# Patient Record
Sex: Female | Born: 1980 | Race: White | Hispanic: No | Marital: Single | Smoking: Current every day smoker
Health system: Southern US, Community
[De-identification: ages and names within clinical notes are randomized; demographics above are authoritative.]

---

## 2006-07-12 ENCOUNTER — Emergency Department: Payer: Self-pay | Admitting: Emergency Medicine

## 2007-03-09 ENCOUNTER — Emergency Department: Payer: Self-pay | Admitting: Emergency Medicine

## 2010-08-12 ENCOUNTER — Emergency Department: Payer: Self-pay | Admitting: Emergency Medicine

## 2011-08-07 IMAGING — CT CT HEAD WITHOUT CONTRAST
2 series · 16 of 30 positions shown, 20 images · non-contrast
Comparison: none

REASON FOR EXAM: R sided headache
COMMENTS:

PROCEDURE:     CT  - CT HEAD WITHOUT CONTRAST  - August 12, 2010  [DATE]
RESULT:
HISTORY: Headache.
COMPARISON STUDIES:  No prior.

[Series 2: without · axial · non-contrast · 0.44mm/px · z∈[+200,+320]mm · 13 of 30 slices shown, 17 images]
[im 3/30  brain]
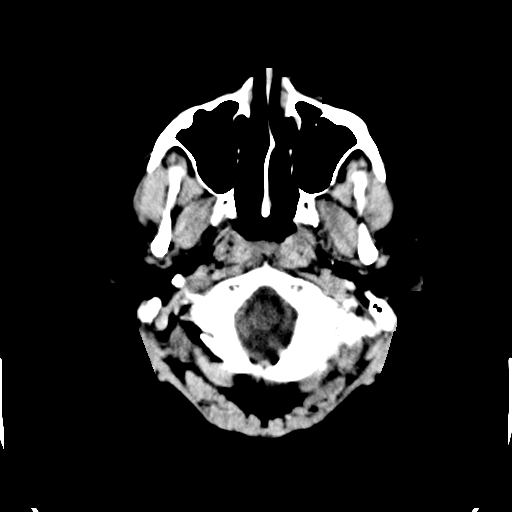
[im 3/30  bone]
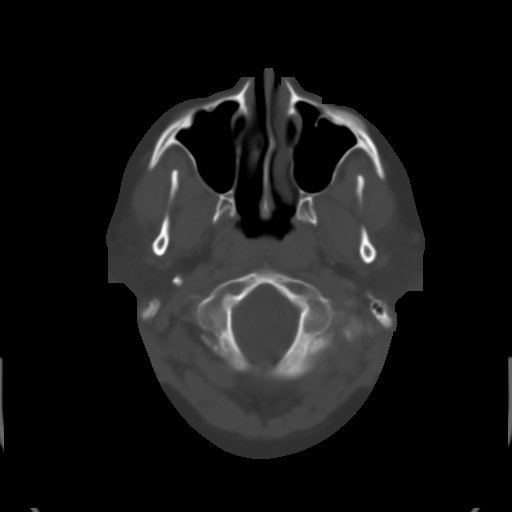
[im 5/30  brain]
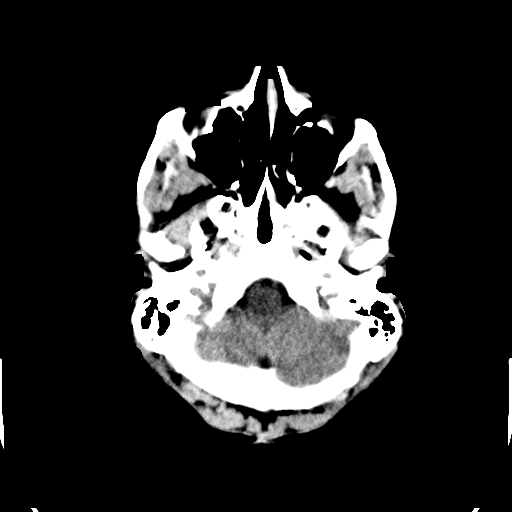
[im 7/30  brain]
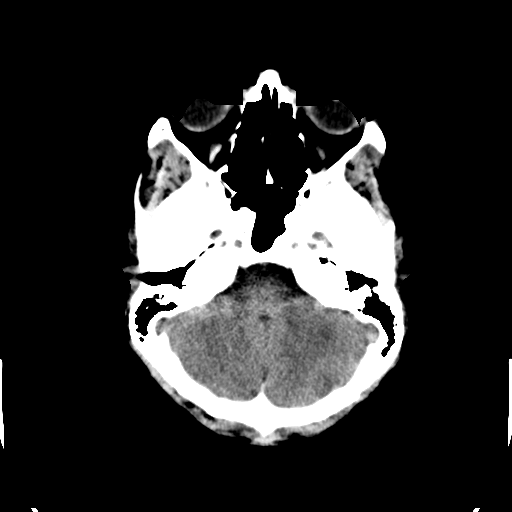
[im 9/30  brain]
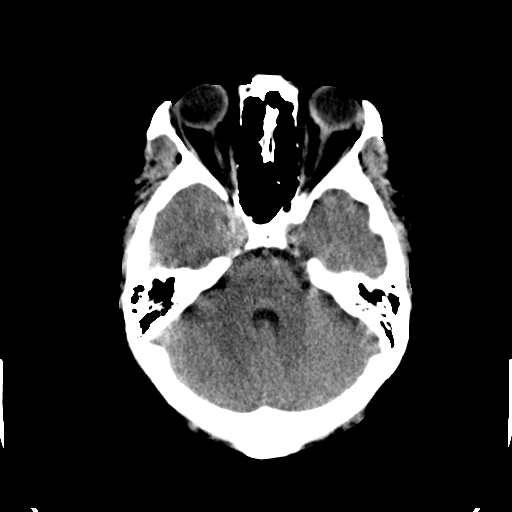
[im 11/30  brain]
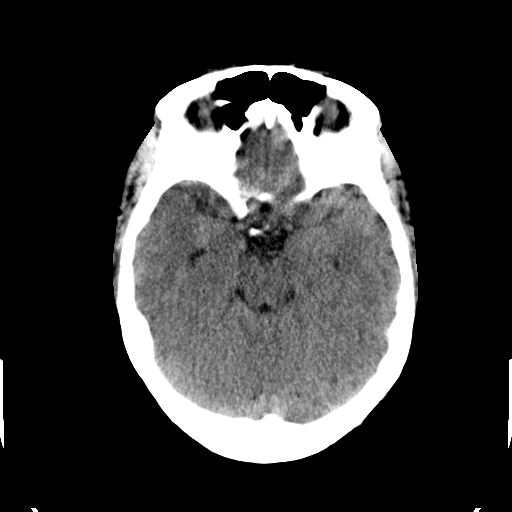
[im 11/30  bone]
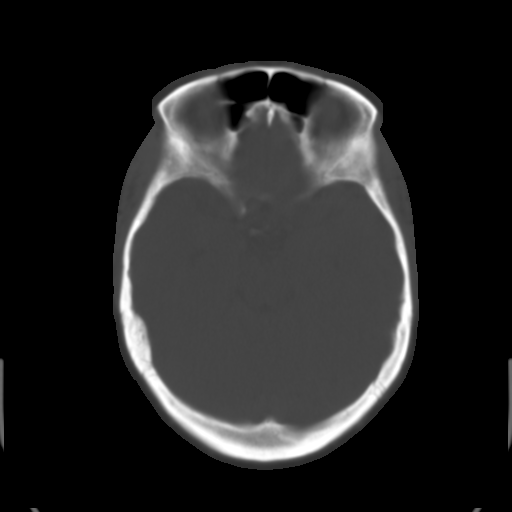
[im 13/30  brain]
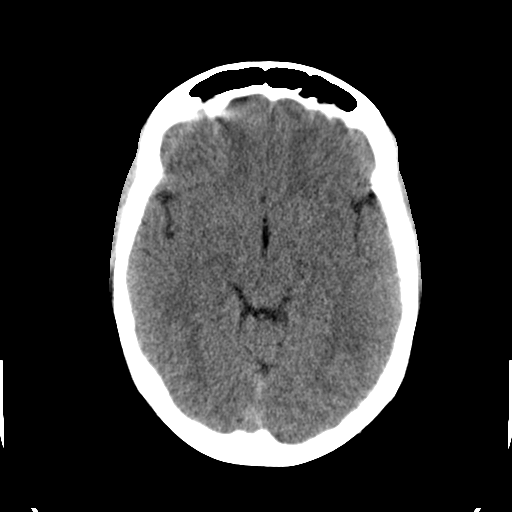
[im 15/30  brain]
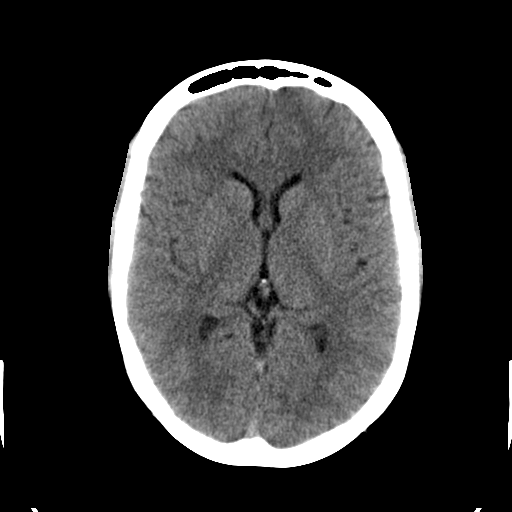
[im 17/30  brain]
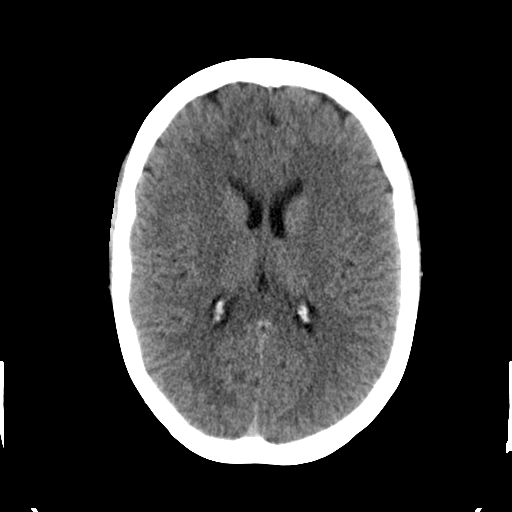
[im 19/30  brain]
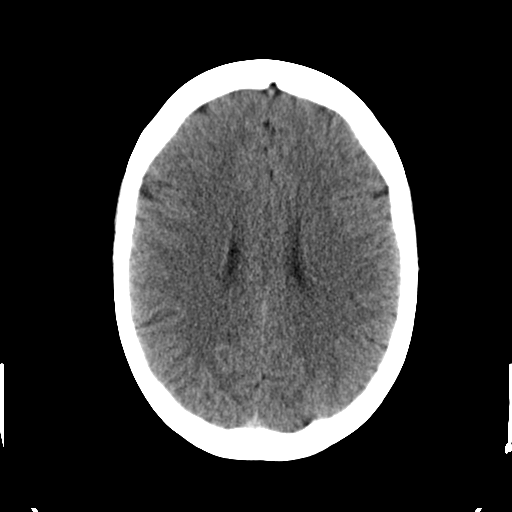
[im 19/30  bone]
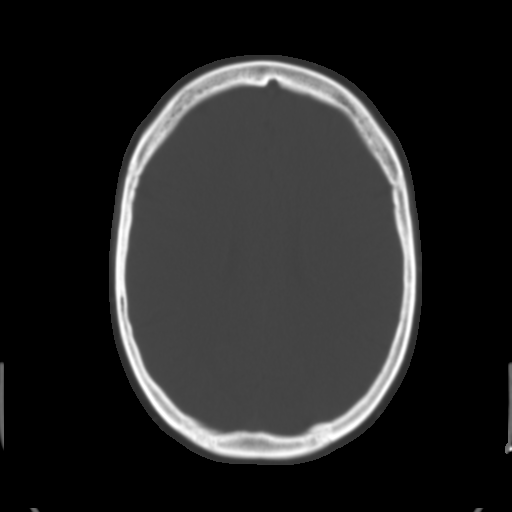
[im 21/30  brain]
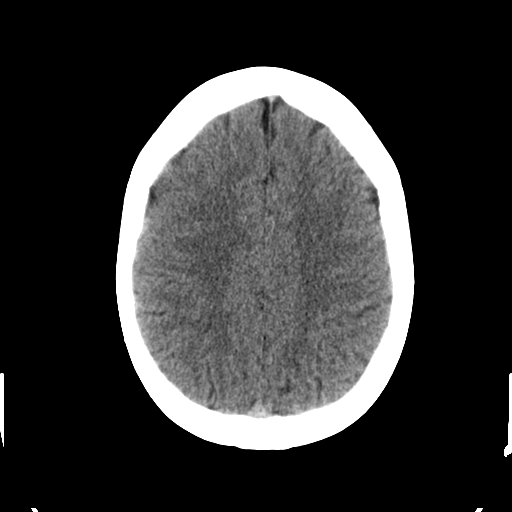
[im 23/30  brain]
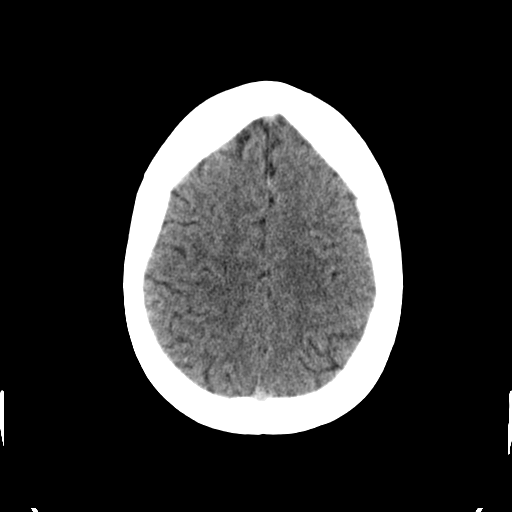
[im 25/30  brain]
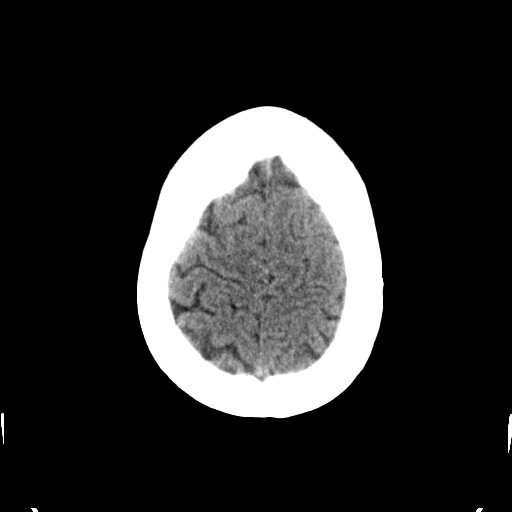
[im 27/30  brain]
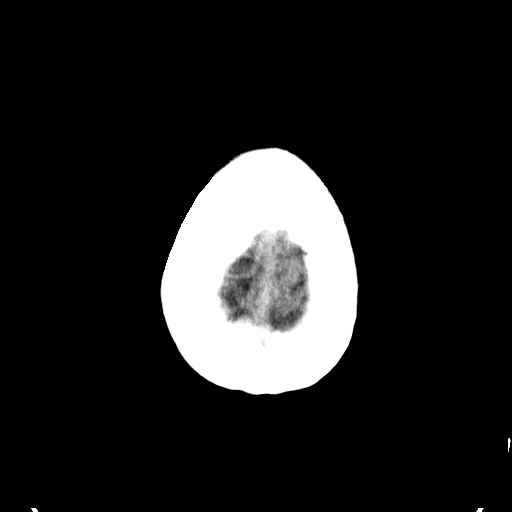
[im 27/30  bone]
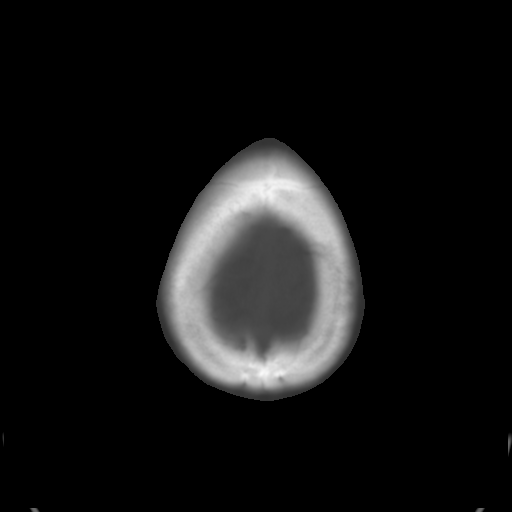

[Series 3: bone · axial · 0.44mm/px · z∈[+200,+240]mm · 3 of 30 slices shown]
[im 3/30  bone]
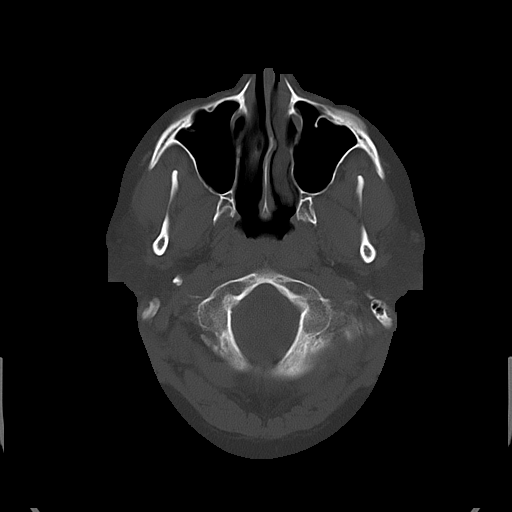
[im 7/30  bone]
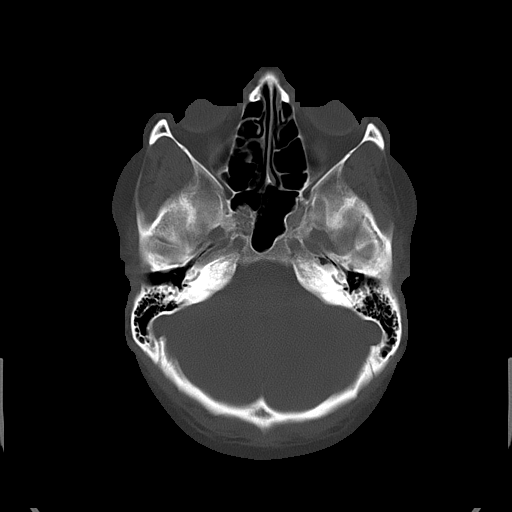
[im 11/30  bone]
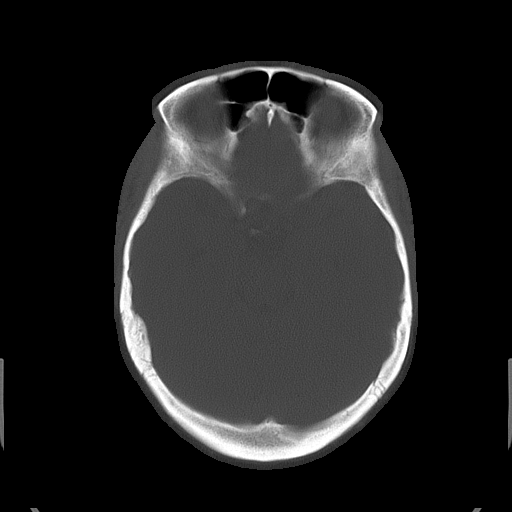

[16 of 30 positions shown; findings below may reference images not displayed]

FINDINGS: There is no mass lesion. No hydrocephalus. There is no hemorrhage.
Mucous retention cyst is noted of the left maxillary sinus. The paranasal
sinuses are otherwise clear. No bony abnormality identified.
IMPRESSION: No acute abnormality.

## 2011-12-06 ENCOUNTER — Emergency Department: Payer: Self-pay | Admitting: Internal Medicine

## 2012-04-29 ENCOUNTER — Emergency Department: Payer: Self-pay | Admitting: Emergency Medicine

## 2012-05-05 ENCOUNTER — Emergency Department: Payer: Self-pay | Admitting: *Deleted

## 2012-05-05 LAB — URINALYSIS, COMPLETE
Bilirubin,UR: NEGATIVE
Blood: NEGATIVE
Glucose,UR: NEGATIVE mg/dL (ref 0–75)
Leukocyte Esterase: NEGATIVE
RBC,UR: 1 /HPF (ref 0–5)
Specific Gravity: 1.021 (ref 1.003–1.030)
Squamous Epithelial: 34

## 2012-05-05 LAB — PREGNANCY, URINE: Pregnancy Test, Urine: NEGATIVE m[IU]/mL

## 2012-05-06 LAB — COMPREHENSIVE METABOLIC PANEL
Alkaline Phosphatase: 100 U/L (ref 50–136)
Anion Gap: 4 — ABNORMAL LOW (ref 7–16)
Bilirubin,Total: 0.5 mg/dL (ref 0.2–1.0)
Calcium, Total: 8.5 mg/dL (ref 8.5–10.1)
Chloride: 103 mmol/L (ref 98–107)
Co2: 32 mmol/L (ref 21–32)
Creatinine: 1.33 mg/dL — ABNORMAL HIGH (ref 0.60–1.30)
EGFR (African American): >60
EGFR (Non-African Amer.): 54 — ABNORMAL LOW
Glucose: 103 mg/dL — ABNORMAL HIGH (ref 65–99)
Osmolality: 277 (ref 275–301)
Potassium: 4.3 mmol/L (ref 3.5–5.1)
SGOT(AST): 72 U/L — ABNORMAL HIGH (ref 15–37)
Sodium: 139 mmol/L (ref 136–145)

## 2012-05-06 LAB — CBC
HCT: 48.6 % — ABNORMAL HIGH (ref 35.0–47.0)
HGB: 16.1 g/dL — ABNORMAL HIGH (ref 12.0–16.0)
MCH: 30.1 pg (ref 26.0–34.0)
MCV: 91 fL (ref 80–100)
RBC: 5.36 10*6/uL — ABNORMAL HIGH (ref 3.80–5.20)

## 2012-05-06 LAB — LIPASE, BLOOD: Lipase: 50 U/L — ABNORMAL LOW (ref 73–393)

## 2012-11-30 IMAGING — CR RIGHT FOOT COMPLETE - 3+ VIEW
1 series · 3 of 3 positions shown · non-contrast
Comparison: none

REASON FOR EXAM: twisted foot
COMMENTS:   LMP: Two weeks ago

PROCEDURE:     DXR - DXR FOOT RT COMPLETE W/OBLIQUES  - December 06, 2011  [DATE]
RESULT:     No fracture, dislocation or other acute bony abnormality is
identified.

[Series 1: x foot ap right · 0.14mm/px · 3 of 3 slices shown]
[im 1/3]
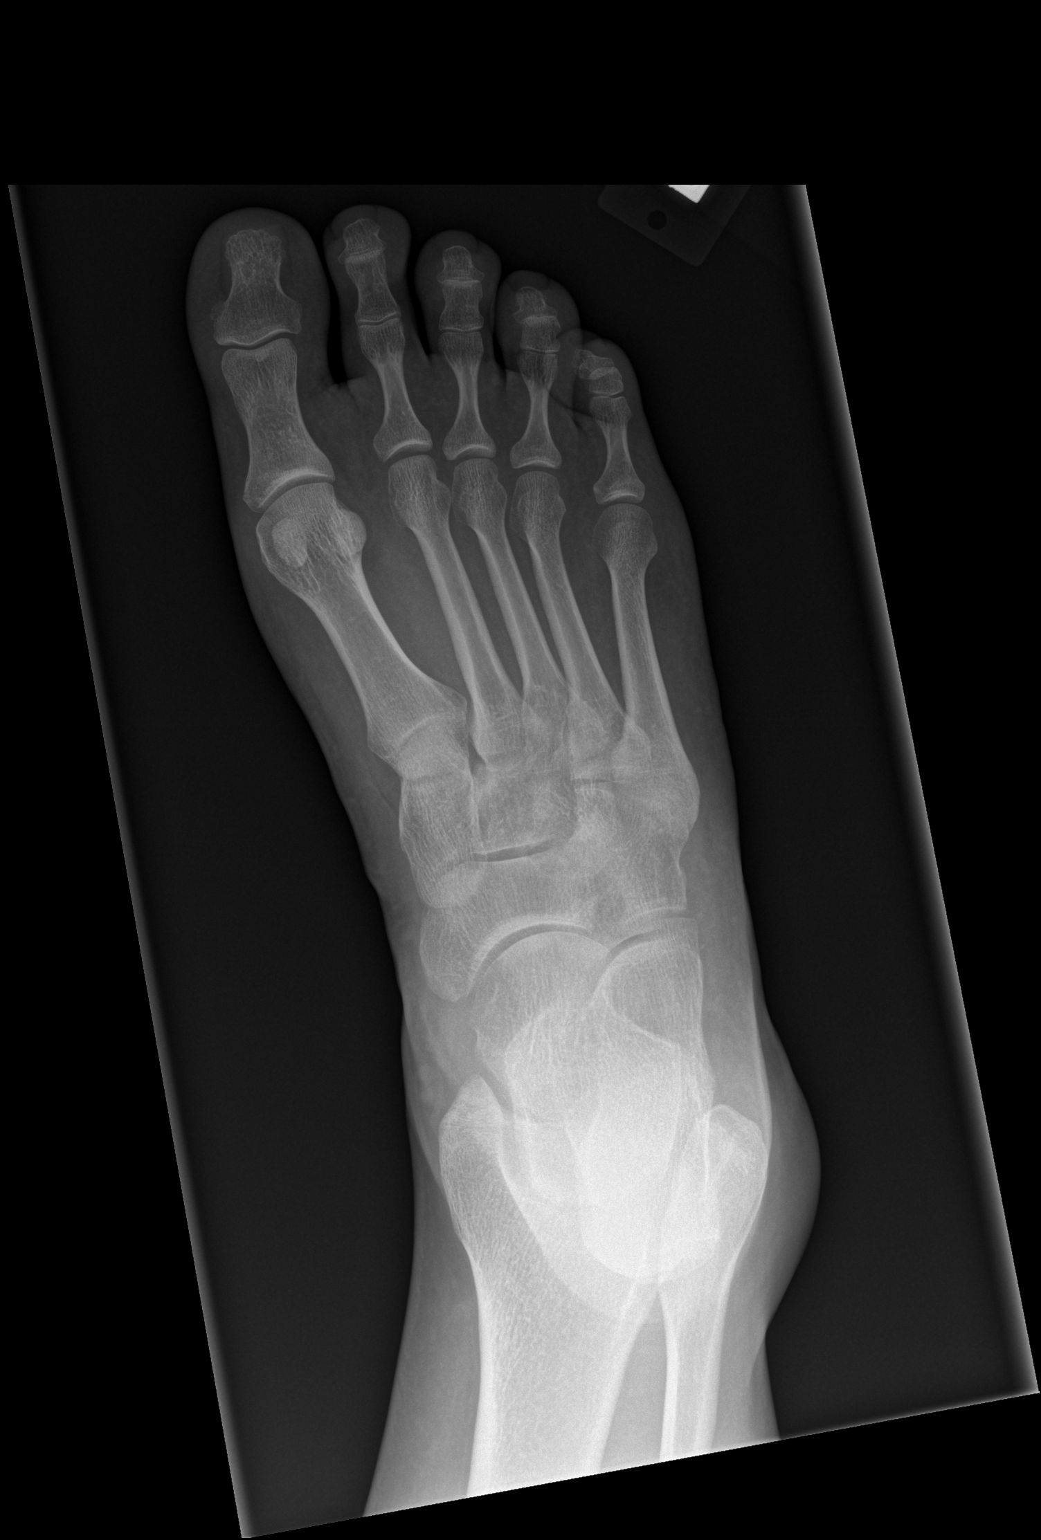
[im 2/3]
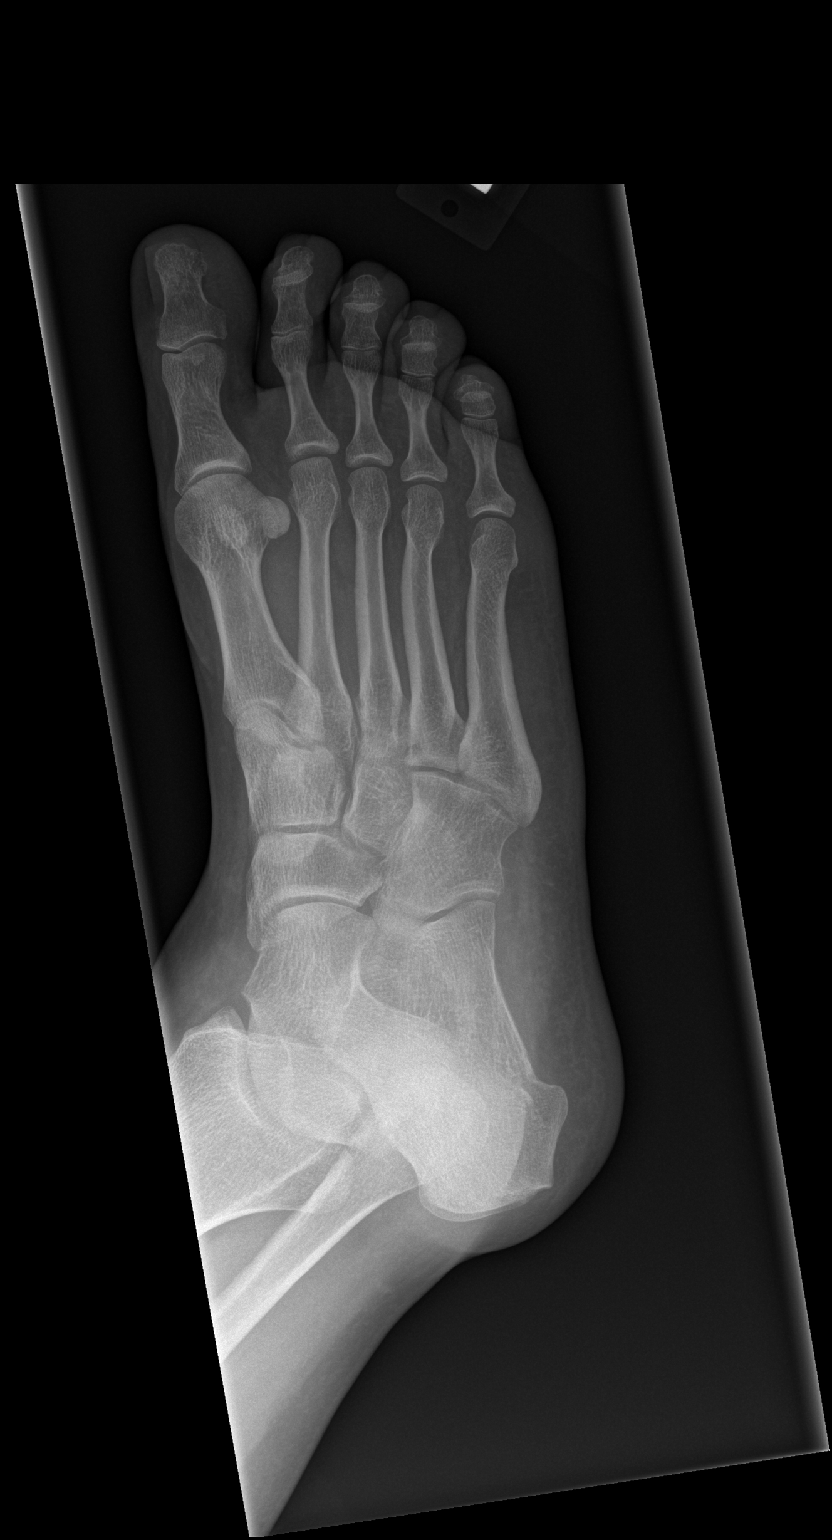
[im 3/3]
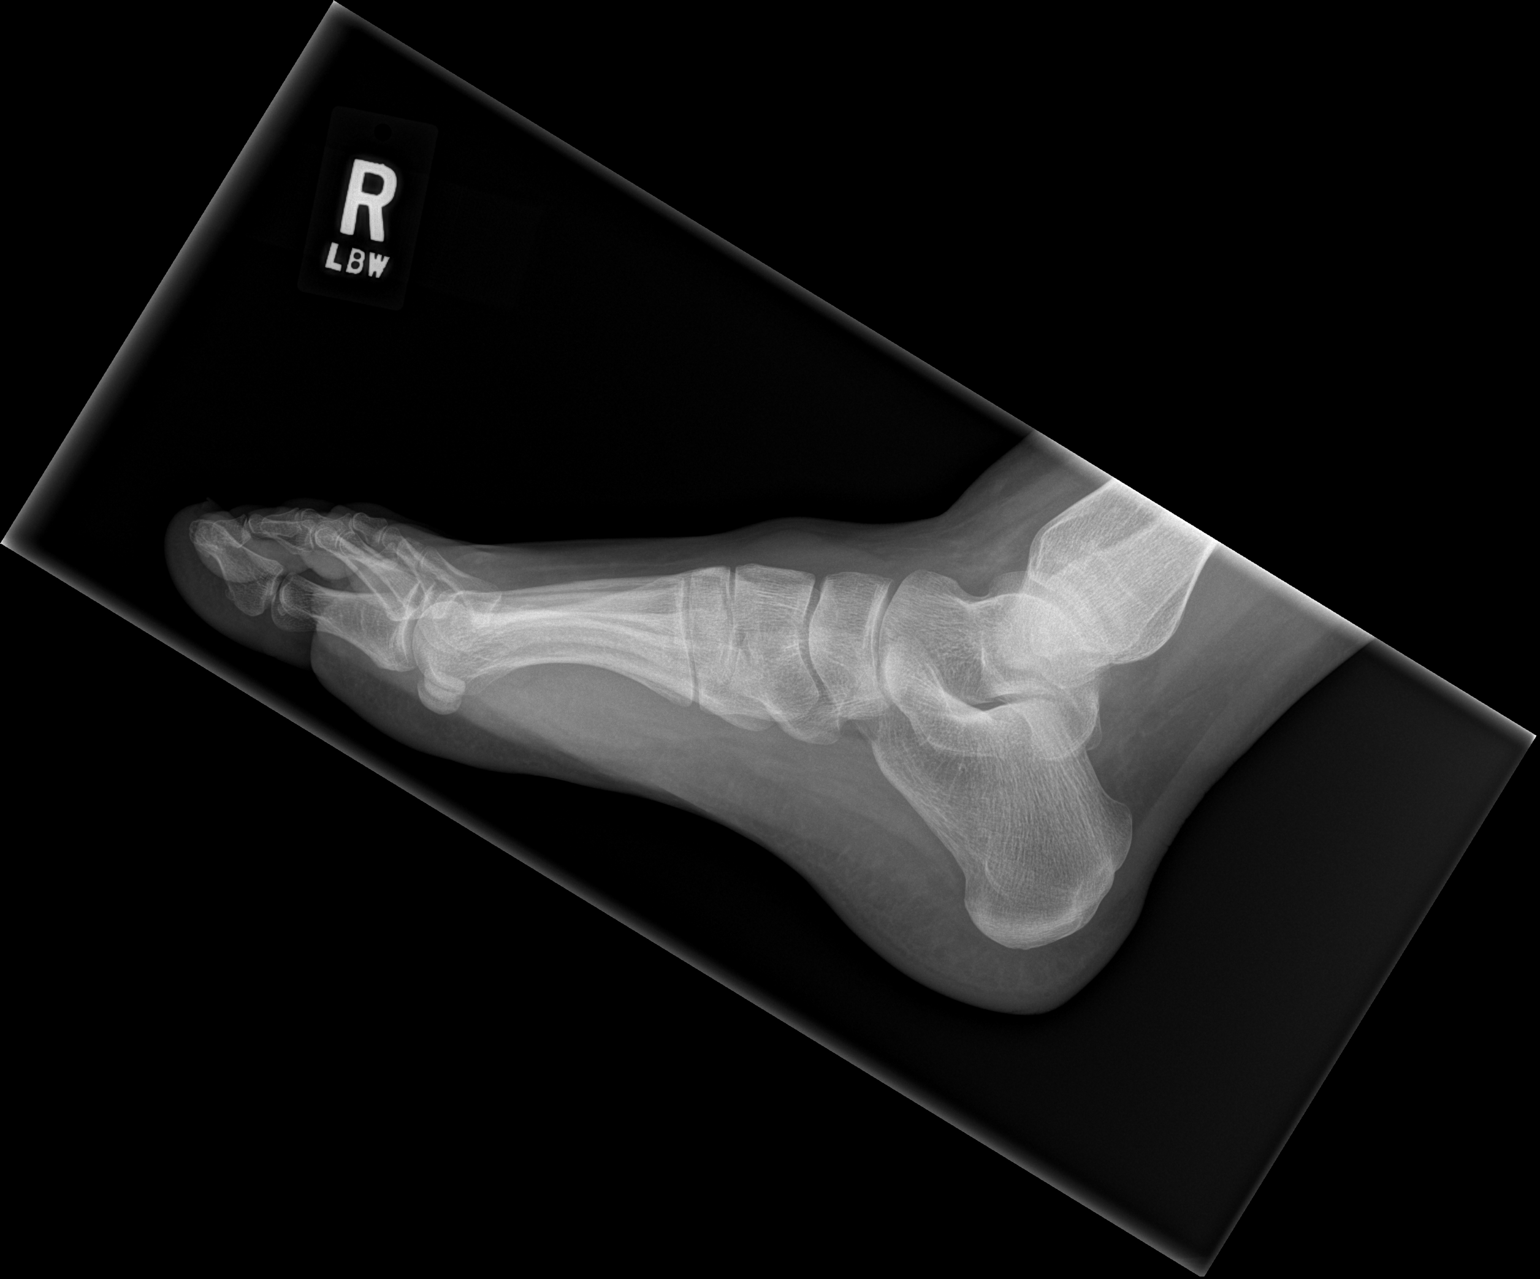

[3 of 3 positions shown; findings below may reference images not displayed]

IMPRESSION: 1.     No significant osseous abnormalities are noted.

## 2013-05-01 IMAGING — US ABDOMEN ULTRASOUND LIMITED
1 series · 14 of 25 positions shown · non-contrast
Comparison: none

REASON FOR EXAM: RUQ pain, nausea
COMMENTS:   Body Site: GB and Fossa, CBD, Head of Pancreas

[Series 1: abdomen ultrasound limited · 0.31mm/px · 14 of 40 slices shown]
[im 1/40]
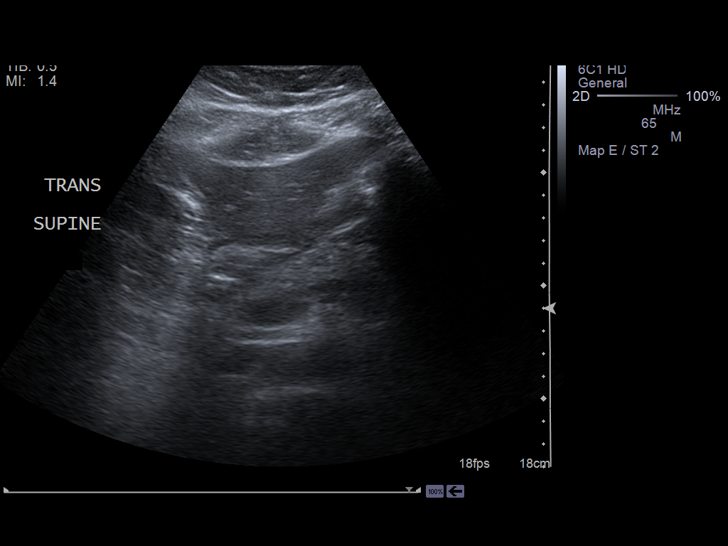
[im 4/40]
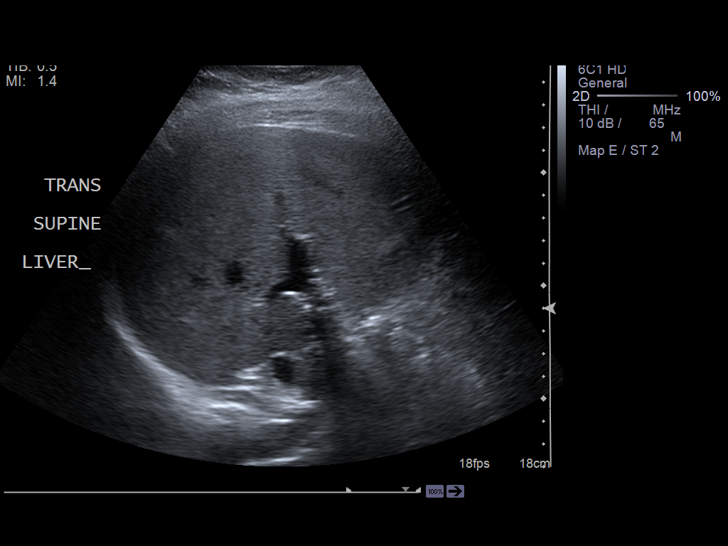
[im 7/40]
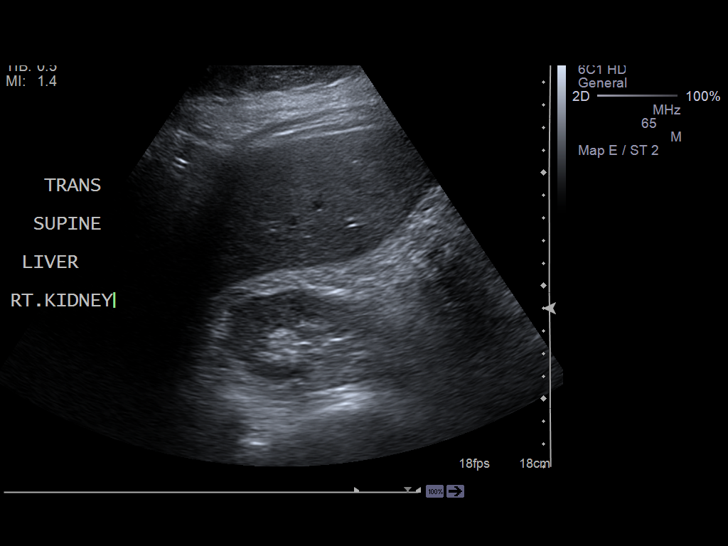
[im 10/40]
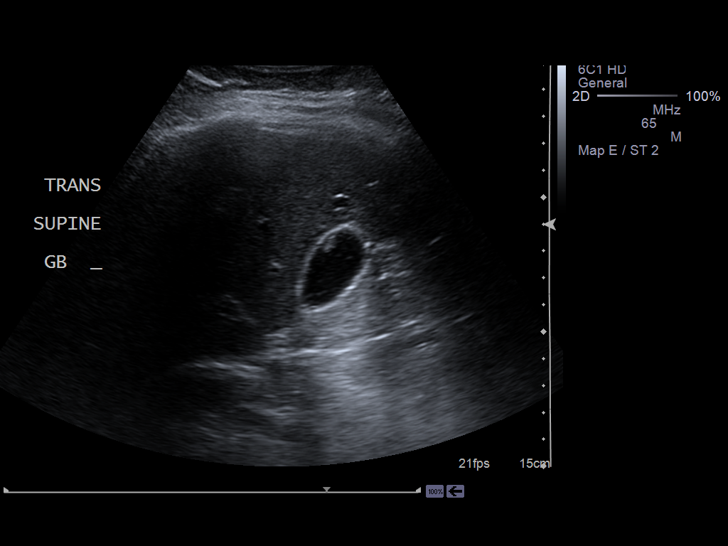
[im 14/40]
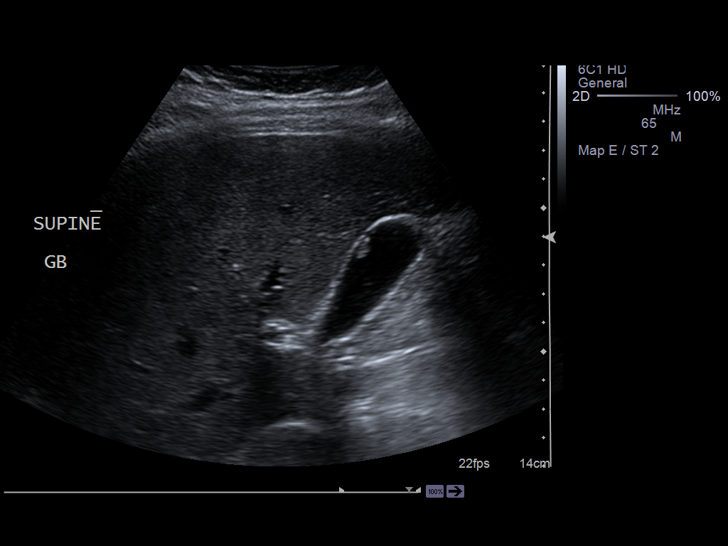
[im 15/40]
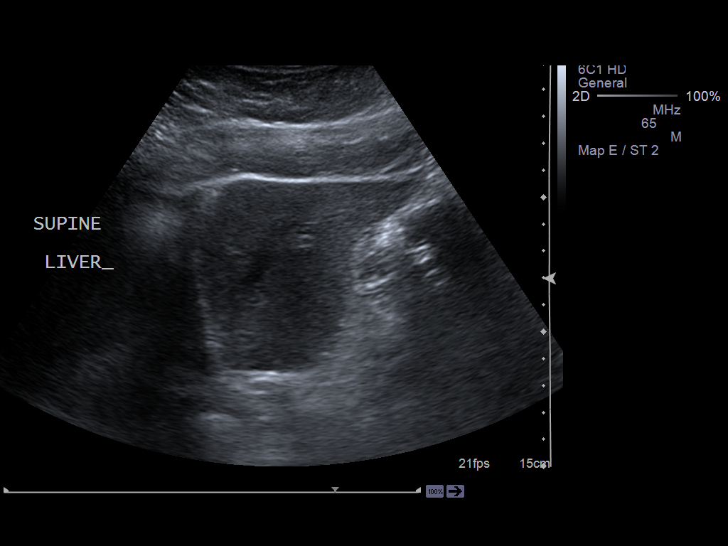
[im 18/40]
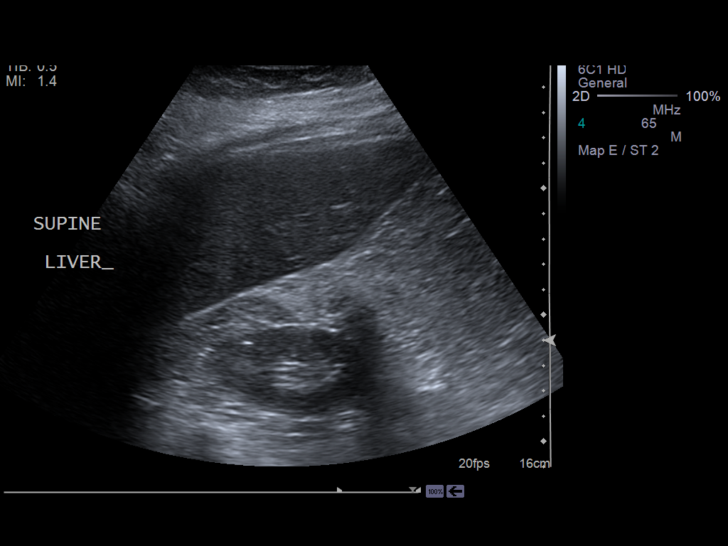
[im 22/40]
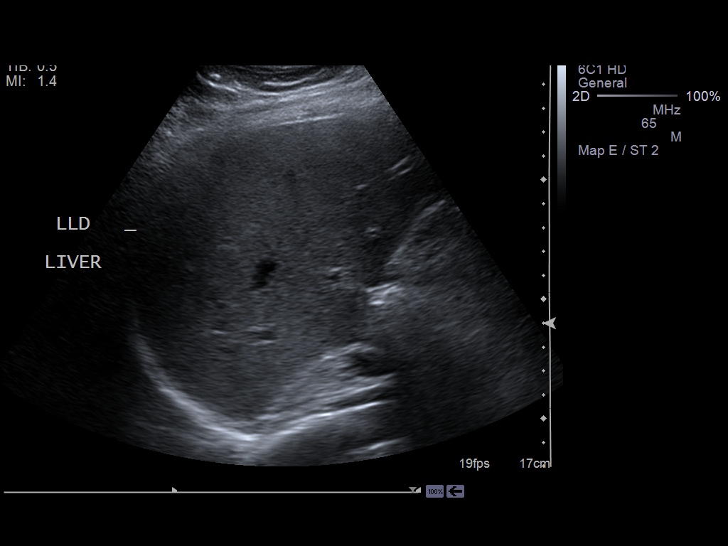
[im 25/40]
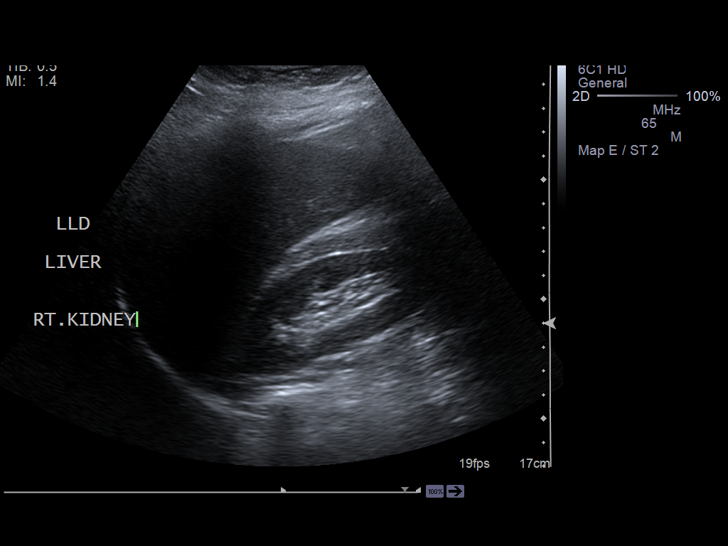
[im 27/40]
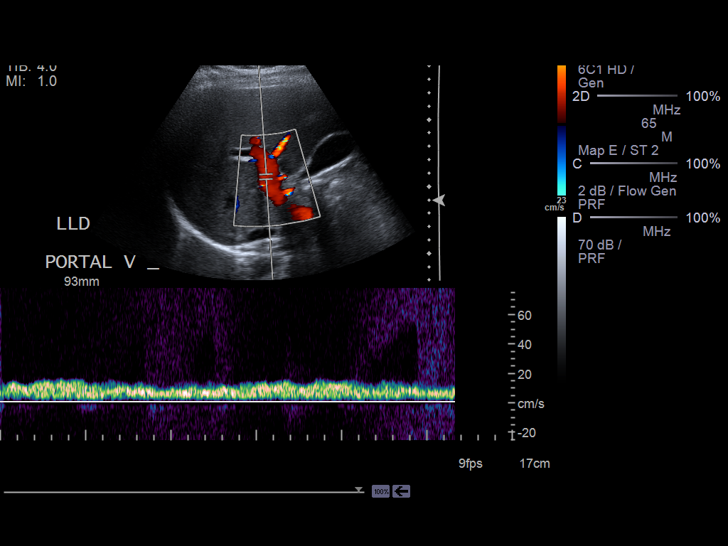
[im 30/40]
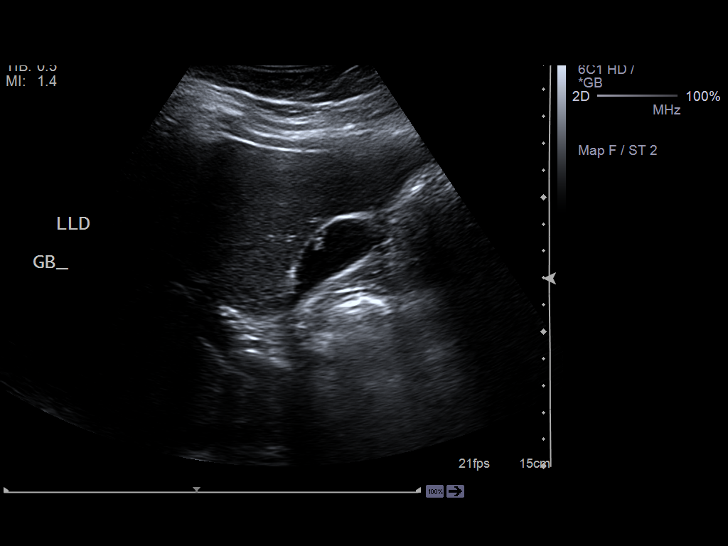
[im 33/40]
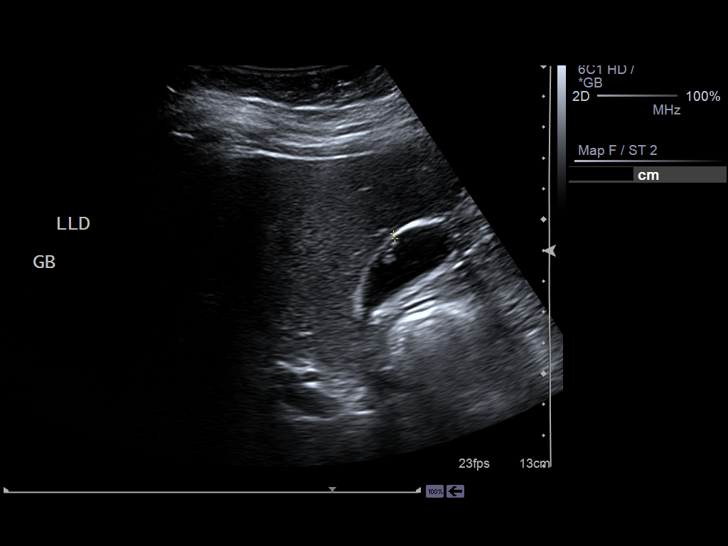
[im 36/40]
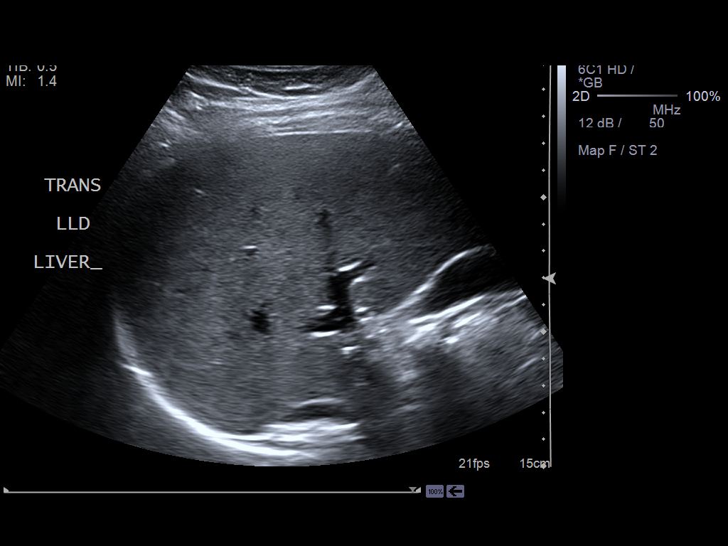
[im 40/40]
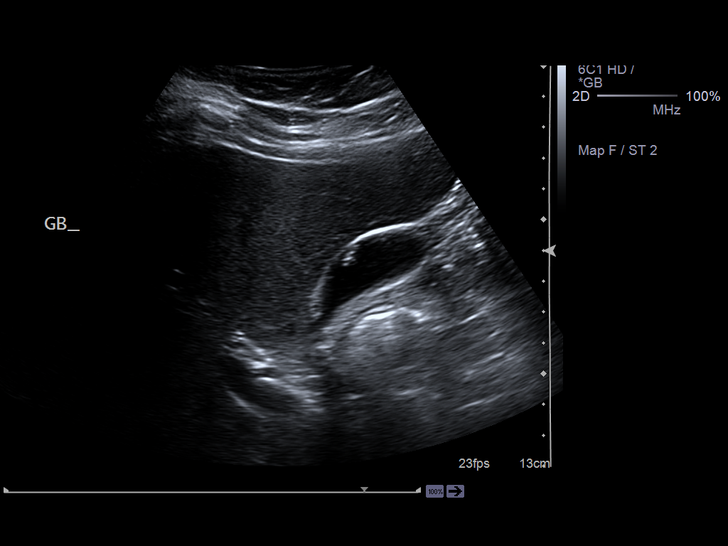

[14 of 25 positions shown; findings below may reference images not displayed]

PROCEDURE:     US  - US ABDOMEN LIMITED SURVEY  - May 06, 2012  [DATE]

RESULT:     The gallbladder is adequately distended. There is echogenic
adherent nonshadowing focus along the gallbladder wall measuring
approximately 4 mm x 7 mm. This could reflect an adherent nonshadowing stone
versus polyp. There is no gallbladder wall thickening or pericholecystic
fluid. There is no positive sonographic Murphy's sign. The common bile duct
is normal at 4.1 mm in diameter.

The liver exhibits normal echotexture with no focal mass nor ductal
dilation. Portal venous flow is normal in direction toward the liver. The
visualized portions of the pancreatic head are grossly normal but bowel gas
limits the study in this regard. The right kidney exhibits no acute
abnormality.
IMPRESSION: 1. There are nonmobile stones versus polyps in the gallbladder. There are no
sonographic signs of acute cholecystitis.
2. The observed portions of the liver and right kidney are normal. The
pancreas could only be partially imaged.

## 2015-10-13 ENCOUNTER — Encounter: Payer: Self-pay | Admitting: Emergency Medicine

## 2015-10-13 DIAGNOSIS — F17210 Nicotine dependence, cigarettes, uncomplicated: Secondary | ICD-10-CM | POA: Diagnosis not present

## 2015-10-13 DIAGNOSIS — K0889 Other specified disorders of teeth and supporting structures: Secondary | ICD-10-CM | POA: Insufficient documentation

## 2015-10-13 NOTE — ED Notes (Signed)
Patient ambulatory to triage with steady gait, without difficulty or distress noted; pt reports left top toothache intermittently x 2wks

## 2015-10-14 ENCOUNTER — Emergency Department
Admission: EM | Admit: 2015-10-14 | Discharge: 2015-10-14 | Disposition: A | Discharge status: Home / Self Care | Payer: 59 | Attending: Emergency Medicine | Admitting: Emergency Medicine

## 2015-10-14 DIAGNOSIS — K0889 Other specified disorders of teeth and supporting structures: Secondary | ICD-10-CM

## 2015-10-14 MED ORDER — AMOXICILLIN 250 MG/5ML PO SUSR
500.0000 mg | Freq: Three times a day (TID) | ORAL | Status: DC
  Administered 2015-10-14: 500 mg via ORAL
  Filled 2015-10-14: qty 10

## 2015-10-14 MED ORDER — AMOXICILLIN 400 MG/5ML PO SUSR: 500.0000 mg | Freq: Three times a day (TID) | ORAL | Status: DC

## 2015-10-14 MED ORDER — HYDROCODONE-ACETAMINOPHEN 7.5-325 MG/15ML PO SOLN: 10.0000 mL | ORAL | Status: AC | PRN

## 2015-10-14 MED ORDER — HYDROCODONE-ACETAMINOPHEN 7.5-325 MG/15ML PO SOLN
10.0000 mL | Freq: Once | ORAL | Status: AC
  Administered 2015-10-14: 10 mL via ORAL
  Filled 2015-10-14: qty 15

## 2015-10-14 MED ORDER — IBUPROFEN 100 MG/5ML PO SUSP
600.0000 mg | Freq: Once | ORAL | Status: AC
  Administered 2015-10-14: 600 mg via ORAL
  Filled 2015-10-14: qty 30

## 2015-10-14 MED ORDER — IBUPROFEN 100 MG/5ML PO SUSP
ORAL | Status: AC
  Administered 2015-10-14: 600 mg via ORAL
  Filled 2015-10-14: qty 30

## 2015-10-14 NOTE — ED Provider Notes (Signed)
[facility] Emergency Department Provider Note ____________________________________________  Time seen: Approximately 2:37 AM  I have reviewed the triage vital signs and the nursing notes.   HISTORY  Chief Complaint Dental Pain    HPI Tarry KosKristy L Speltz is a 34 y.o. female who presents to the ED from home with a chief complaint of toothache. Patient complains of left upper molar pain intermittently 2 weeks, worse last evening. Tried a BC powder without relief. Denies associated symptoms of fever, chills, chest pain, shortness of breath, abdominal pain, nausea, vomiting, diarrhea. Denies recent trauma.   Past medical history None   There are no active problems to display for this patient.   History reviewed. No pertinent past surgical history.  No current outpatient prescriptions on file.  Allergies Erythromycin  No family history on file.  Social History Social History  Substance Use Topics  . Smoking status: Current Every Day Smoker -- 0.50 packs/day    Types: Cigarettes  . Smokeless tobacco: None  . Alcohol Use: None    Review of Systems Constitutional: No fever/chills Eyes: No visual changes. ENT: Positive for dental pain. No sore throat. Cardiovascular: Denies chest pain. Respiratory: Denies shortness of breath. Gastrointestinal: No abdominal pain.  No nausea, no vomiting.  No diarrhea.  No constipation. Genitourinary: Negative for dysuria. Musculoskeletal: Negative for back pain. Skin: Negative for rash. Neurological: Negative for headaches, focal weakness or numbness.  10-point ROS otherwise negative.  ____________________________________________   PHYSICAL EXAM:  VITAL SIGNS: ED Triage Vitals  Enc Vitals Group     BP 10/13/15 2317 154/93 mmHg     Pulse Rate 10/13/15 2317 73     Resp 10/13/15 2317 20     Temp 10/13/15 2317 98.3 F (36.8 C)     Temp Source 10/13/15 2317 Oral     SpO2 10/13/15 2317 95 %     Weight 10/13/15  2317 200 lb (90.719 kg)     Height 10/13/15 2317 5\' 5"  (1.651 m)     Head Cir --      Peak Flow --      Pain Score 10/13/15 2317 10     Pain Loc --      Pain Edu? --      Excl. in GC? --     Constitutional: Alert and oriented. Well appearing and in no acute distress. Eyes: Conjunctivae are normal. PERRL. EOMI. Head: Atraumatic. Nose: No congestion/rhinnorhea. Mouth/Throat: Mucous membranes are moist.  Oropharynx non-erythematous.  Left posterior upper molar with prior fractured tooth; tenderness to palpation with tongue blade. No intraoral or extra oral swelling. Neck: No stridor.   Cardiovascular: Normal rate, regular rhythm. Grossly normal heart sounds.  Good peripheral circulation. Respiratory: Normal respiratory effort.  No retractions. Lungs CTAB. Gastrointestinal: Soft and nontender. No distention. No abdominal bruits. No CVA tenderness. Musculoskeletal: No lower extremity tenderness nor edema.  No joint effusions. Neurologic:  Normal speech and language. No gross focal neurologic deficits are appreciated. No gait instability. Skin:  Skin is warm, dry and intact. No rash noted. Psychiatric: Mood and affect are normal. Speech and behavior are normal.  ____________________________________________   LABS (all labs ordered are listed, but only abnormal results are displayed)  Labs Reviewed - No data to display ____________________________________________  EKG  None ____________________________________________  RADIOLOGY  None ____________________________________________   PROCEDURES  Procedure(s) performed: None  Critical Care performed: No  ____________________________________________   INITIAL IMPRESSION / ASSESSMENT AND PLAN / ED COURSE  Pertinent labs & imaging results that  were available during my care of the patient were reviewed by me and considered in my medical decision making (see chart for details).  34 year old female with dentalgia; requesting  liquid medicines. Will treat with amoxicillin, analgesia and follow-up with her dentist. Strict return precautions given. Patient spouse verbalize understanding and agree with plan of care. ____________________________________________   FINAL CLINICAL IMPRESSION(S) / ED DIAGNOSES  Final diagnoses:  Clemon Chambers, MD 10/14/15 [phone]

## 2015-10-14 NOTE — Discharge Instructions (Signed)
1. Take antibiotic as prescribed (amoxicillin 3 times daily 7 days). 2. Take pain medicines as needed (over-the-counter ibuprofen/Lortab). 3. Return to the ER for worsening symptoms, persistent vomiting, difficulty breathing or other concerns.

## 2020-01-26 ENCOUNTER — Ambulatory Visit: Payer: Self-pay | Attending: Internal Medicine

## 2020-01-26 DIAGNOSIS — Z23 Encounter for immunization: Secondary | ICD-10-CM

## 2020-01-27 NOTE — Progress Notes (Signed)
   Covid-19 Vaccination Clinic  Name:  Samantha Murray    MRN: [id number] DOB: 1980/11/15  01/27/2020  [name] was observed post Covid-19 immunization for 15 minutes without incident. She was provided with Vaccine Information Sheet and instruction to access the V-Safe system.   [name] was instructed to call 911 with any severe reactions post vaccine: Marland Kitchen Difficulty breathing  . Swelling of face and throat  . A fast heartbeat  . A bad rash all over body  . Dizziness and weakness   Immunizations Administered    Name Date Dose VIS Date Route   Moderna COVID-19 Vaccine 01/26/2020  3:27 PM 0.5 mL 10/03/2019 Intramuscular   Manufacturer: Moderna   Lot: [id number]   NDC: 01658-006-34

## 2020-02-23 ENCOUNTER — Ambulatory Visit: Payer: Self-pay | Attending: Internal Medicine

## 2020-02-23 DIAGNOSIS — Z23 Encounter for immunization: Secondary | ICD-10-CM

## 2020-02-23 NOTE — Progress Notes (Signed)
   Covid-19 Vaccination Clinic  Name:  [name]    MRN: [id number] DOB: 19-Feb-1981  02/23/2020  [name] was observed post Covid-19 immunization for 15 minutes without incident. She was provided with Vaccine Information Sheet and instruction to access the V-Safe system.   [name] was instructed to call 911 with any severe reactions post vaccine: Marland Kitchen Difficulty breathing  . Swelling of face and throat  . A fast heartbeat  . A bad rash all over body  . Dizziness and weakness   Immunizations Administered    Name Date Dose VIS Date Route   Moderna COVID-19 Vaccine 02/23/2020  1:13 PM 0.5 mL 10/2019 Intramuscular   Manufacturer: Moderna   Lot: [id number]   NDC: 77034-035-24

## 2022-04-07 ENCOUNTER — Ambulatory Visit
Admission: EM | Admit: 2022-04-07 | Discharge: 2022-04-07 | Disposition: A | Discharge status: Home / Self Care | Payer: BC Managed Care – PPO

## 2022-04-07 DIAGNOSIS — R600 Localized edema: Secondary | ICD-10-CM | POA: Diagnosis not present

## 2022-04-07 DIAGNOSIS — R21 Rash and other nonspecific skin eruption: Secondary | ICD-10-CM

## 2022-04-07 DIAGNOSIS — G8929 Other chronic pain: Secondary | ICD-10-CM | POA: Diagnosis not present

## 2022-04-07 DIAGNOSIS — M79672 Pain in left foot: Secondary | ICD-10-CM

## 2022-04-07 NOTE — ED Triage Notes (Signed)
Pt c/o Left side foot swelling and Red spots along lower leg. X1day  Pt wears steel toed shoes at work and states that her feet have been bothering her for a while but the bruising started today.

## 2022-04-07 NOTE — Discharge Instructions (Signed)
-  Your exam today is not consistent with typical presentation of a blood clot. - The rash may be consistent with contact dermatitis or possibly fungal infection.  I would try hydrocortisone cream twice a day to the area for the next week.  If no improvement after a few days or 1 week, switch to topical antifungal such as the one you purchase for athlete's foot or jock itch over-the-counter. - Put your feet up after work to help with the swelling.  Consider compression hose as well. - If you develop diffuse redness, warmth, calf pain, have pain with bearing weight, please go to emergency department for work-up of possible DVT. - Follow-up with your primary care provider or [facility] regarding your foot pain if that becomes of concern as well.

## 2022-04-07 NOTE — ED Provider Notes (Signed)
[facility]    CSN: [id number] Arrival date & time: 04/07/22  1808      History   Chief Complaint Chief Complaint  Patient presents with   Foot Swelling    Left     HPI Samantha Murray is a 41 y.o. female presenting for evaluation of rash of left ankle/lower leg that she noticed today.  She says they do not itch or burn.  Denies any contact with any known allergens.  Has not used any different topicals on skin.  No different detergents reported.  Patient states she has peripheral edema of bilateral feet which is a persistent problem.  Swelling goes down when she is able to put her feet up after work.  Works long hours and [employer] steel toed shoes.  Patient reports chronic bilateral feet pain.  States that she is having more pain of the left foot at this time and over the midfoot.  No reports of any numbness or tingling.  Patient reports that she wanted to be seen today to make sure she did not potentially have a blood clot.  No history of DVT.  Of note, patient is a smoker.  She is denying any chest pain or shortness of breath.  Oxygen is 94% to 95% today.  HPI  History reviewed. No pertinent past medical history.  There are no problems to display for this patient.   History reviewed. No pertinent surgical history.  OB History   No obstetric history on file.      Home Medications    Prior to Admission medications   Not on File    Family History History reviewed. No pertinent family history.  Social History Social History   Tobacco Use   Smoking status: Every Day    Packs/day: 0.50    Types: Cigarettes  Vaping Use   Vaping Use: Never used  Substance Use Topics   Alcohol use: Never   Drug use: Never     Allergies   Erythromycin   Review of Systems Review of Systems  Respiratory:  Negative for shortness of breath.   Cardiovascular:  Negative for chest pain.  Musculoskeletal:  Positive for arthralgias and joint swelling. Negative for gait problem.   Skin:  Positive for color change and rash.  Neurological:  Negative for weakness and numbness.    Physical Exam Triage Vital Signs ED Triage Vitals  Enc Vitals Group     BP 04/07/22 1836 (!) 144/94     Pulse Rate 04/07/22 1836 83     Resp 04/07/22 1836 18     Temp 04/07/22 1836 98.6 F (37 C)     Temp Source 04/07/22 1836 Oral     SpO2 04/07/22 1836 94 %     Weight 04/07/22 1835 220 lb (99.8 kg)     Height 04/07/22 1835 5\' 5"  (1.651 m)     Head Circumference --      Peak Flow --      Pain Score 04/07/22 1835 10     Pain Loc --      Pain Edu? --      Excl. in GC? --    No data found.  Updated Vital Signs BP (!) 144/94 (BP Location: Left Arm)   Pulse 83   Temp 98.6 F (37 C) (Oral)   Resp 18   Ht 5\' 5"  (1.651 m)   Wt 220 lb (99.8 kg)   LMP 03/26/2022   SpO2 94%   BMI 36.61 kg/m  Physical Exam Vitals and nursing note reviewed.  Constitutional:      General: She is not in acute distress.    Appearance: Normal appearance. She is not ill-appearing or toxic-appearing.  HENT:     Head: Normocephalic and atraumatic.  Eyes:     General: No scleral icterus.       Right eye: No discharge.        Left eye: No discharge.     Conjunctiva/sclera: Conjunctivae normal.  Cardiovascular:     Rate and Rhythm: Normal rate and regular rhythm.     Heart sounds: Normal heart sounds.  Pulmonary:     Effort: Pulmonary effort is normal. No respiratory distress.     Breath sounds: Normal breath sounds.  Musculoskeletal:     Cervical back: Neck supple.     Right lower leg: Edema (mild non pitting dorsal feet and ankles bilaterally) present.     Left lower leg: Edema present.     Comments: TTP diffusely over metatarsals left foot. Full ROM of foot. Normal pulses. No calf swelling, erythema or TTP  Skin:    General: Skin is dry.     Findings: Rash (erythematous patchy irregular rash left medial ankle/lower leg) present.  Neurological:     General: No focal deficit present.      Mental Status: She is alert. Mental status is at baseline.     Motor: No weakness.     Gait: Gait normal.  Psychiatric:        Mood and Affect: Mood normal.        Behavior: Behavior normal.        Thought Content: Thought content normal.     UC Treatments / Results  Labs (all labs ordered are listed, but only abnormal results are displayed) Labs Reviewed - No data to display  EKG   Radiology No results found.  Procedures Procedures (including critical care time)  Medications Ordered in UC Medications - No data to display  Initial Impression / Assessment and Plan / UC Course  I have reviewed the triage vital signs and the nursing notes.  Pertinent labs & imaging results that were available during my care of the patient were reviewed by me and considered in my medical decision making (see chart for details).  41 year old female presents for evaluation of rash of left inner lower leg that she noticed today.  Denies pruritus or pain.  Cannot think of anything that could cause a rash.  Her main concern is ruling out a DVT.  Patient reports history of chronic bilateral foot pain and peripheral edema.  Symptoms improve with rest and elevation of extremities.  BP slightly elevated today 144/94.  Oxygen ranges between 94% to 95%.  Patient is in no respiratory distress.  Chest clear to auscultation heart regular rate and rhythm.  Patient has erythematous patchy macular rash of left inner lower leg.  No tenderness palpation.  Mild nonpitting edema of dorsal feet and ankles bilaterally.  Tenderness to palpation over the metatarsals of the left foot.  Full range of motion of foot.  No calf swelling, redness or tenderness.  Suspect the rash is likely not related to her chronic foot pain and chronic peripheral edema.  Low risk for DVT.  Advised treating rash at this time with topical cortisone and if that does not work trying topical antifungal cream.  Reviewed importance of elevation and  compression hose.  Reviewed return and ER precautions especially if she were to develop increased swelling,  redness, warmth or pain.   Final Clinical Impressions(s) / UC Diagnoses   Final diagnoses:  Rash and nonspecific skin eruption  Mild peripheral edema     Discharge Instructions      -Your exam today is not consistent with typical presentation of a blood clot. - The rash may be consistent with contact dermatitis or possibly fungal infection.  I would try hydrocortisone cream twice a day to the area for the next week.  If no improvement after a few days or 1 week, switch to topical antifungal such as the one you purchase for athlete's foot or jock itch over-the-counter. - Put your feet up after work to help with the swelling.  Consider compression hose as well. - If you develop diffuse redness, warmth, calf pain, have pain with bearing weight, please go to emergency department for work-up of possible DVT. - Follow-up with your primary care provider or [facility] regarding your foot pain if that becomes of concern as well.     ED Prescriptions   None    PDMP not reviewed this encounter.   Shirlee Latch, PA-C 04/07/22 1911

## 2024-08-08 ENCOUNTER — Ambulatory Visit

## 2024-08-08 DIAGNOSIS — Z23 Encounter for immunization: Secondary | ICD-10-CM | POA: Diagnosis not present
